# Patient Record
Sex: Female | Born: 1985 | Race: Black or African American | Hispanic: No | Marital: Single | Smoking: Never smoker
Health system: Southern US, Community
[De-identification: ages and names within clinical notes are randomized; demographics above are authoritative.]

---

## 2019-11-19 ENCOUNTER — Emergency Department (HOSPITAL_COMMUNITY)
Admission: EM | Admit: 2019-11-19 | Discharge: 2019-11-19 | Disposition: A | Discharge status: Home / Self Care | Payer: BC Managed Care – PPO | Attending: Emergency Medicine | Admitting: Emergency Medicine

## 2019-11-19 ENCOUNTER — Other Ambulatory Visit: Payer: Self-pay

## 2019-11-19 ENCOUNTER — Encounter (HOSPITAL_COMMUNITY): Payer: Self-pay | Admitting: Emergency Medicine

## 2019-11-19 ENCOUNTER — Emergency Department (HOSPITAL_COMMUNITY): Payer: BC Managed Care – PPO

## 2019-11-19 DIAGNOSIS — R05 Cough: Secondary | ICD-10-CM | POA: Diagnosis present

## 2019-11-19 DIAGNOSIS — R059 Cough, unspecified: Secondary | ICD-10-CM

## 2019-11-19 DIAGNOSIS — U071 COVID-19: Secondary | ICD-10-CM | POA: Diagnosis not present

## 2019-11-19 LAB — SARS CORONAVIRUS 2 BY RT PCR (HOSPITAL ORDER, PERFORMED IN ~~LOC~~ HOSPITAL LAB): SARS Coronavirus 2: POSITIVE — AB

## 2019-11-19 MED ORDER — NAPROXEN 500 MG PO TABS: 500.0000 mg | ORAL_TABLET | Freq: Two times a day (BID) | ORAL | 0 refills | Status: AC | PRN

## 2019-11-19 MED ORDER — AEROCHAMBER PLUS FLO-VU LARGE MISC
1.0000 | Freq: Once | Status: AC
  Administered 2019-11-19: 1

## 2019-11-19 MED ORDER — ALBUTEROL SULFATE HFA 108 (90 BASE) MCG/ACT IN AERS
2.0000 | INHALATION_SPRAY | Freq: Once | RESPIRATORY_TRACT | Status: AC
  Administered 2019-11-19: 2 via RESPIRATORY_TRACT
  Filled 2019-11-19: qty 6.7

## 2019-11-19 MED ORDER — FLUTICASONE PROPIONATE 50 MCG/ACT NA SUSP: 1.0000 | Freq: Every day | NASAL | 0 refills | Status: AC

## 2019-11-19 MED ORDER — BENZONATATE 100 MG PO CAPS: 100.0000 mg | ORAL_CAPSULE | Freq: Three times a day (TID) | ORAL | 0 refills | Status: AC

## 2019-11-19 NOTE — ED Provider Notes (Signed)
[facility] EMERGENCY DEPARTMENT Provider Note   CSN: [id number] Arrival date & time: 11/19/19  0630     History Chief Complaint  Patient presents with  . Cough    Heidi Shepard is a 34 y.o. female without significant past medical hx who presents to the ED with complaints of cough for the past 4-5 days. Patient reports cough is intermittently productive of phlegm sputum, she has associated nasal congestion, sinus pressure, ear pain, sore throat, loss of smell, chills & sweats, and dyspnea with coughing episodes. No alleviating/aggravating factors. Tried OTC sinus medicine without much relief. Was recently in [place] @ large gathering. Has not had COVID 19 vaccinations. No sick contacts w/ similar sxs. Denies fever, chest pain, syncope, leg pain/swelling, hemoptysis, recent surgery/trauma, recent long travel, hormone use, personal hx of cancer, or hx of DVT/PE.    HPI     History reviewed. No pertinent past medical history.  There are no problems to display for this patient.   History reviewed. No pertinent surgical history.   OB History   No obstetric history on file.     History reviewed. No pertinent family history.  Social History   Tobacco Use  . Smoking status: Never Smoker  . Smokeless tobacco: Never Used  Substance Use Topics  . Alcohol use: Yes  . Drug use: Never    Home Medications Prior to Admission medications   Not on File    Allergies    Patient has no allergy information on record.  Review of Systems   Review of Systems  Constitutional: Positive for chills. Negative for fever.       Positive for loss of smell.   HENT: Positive for congestion, ear pain, sinus pressure and sore throat.   Respiratory: Positive for cough and shortness of breath.   Cardiovascular: Negative for chest pain and leg swelling.  Gastrointestinal: Negative for abdominal pain and vomiting.  Neurological: Negative for syncope.   Physical Exam Updated  Vital Signs BP 112/69 (BP Location: Left Arm)   Pulse (!) 105   Temp 98.5 F (36.9 C) (Oral)   Resp 20   SpO2 98%   Physical Exam Vitals and nursing note reviewed.  Constitutional:      General: She is not in acute distress.    Appearance: She is well-developed.  HENT:     Head: Normocephalic and atraumatic.     Right Ear: Ear canal normal. Tympanic membrane is not perforated, erythematous, retracted or bulging.     Left Ear: Ear canal normal. Tympanic membrane is not perforated, erythematous, retracted or bulging.     Ears:     Comments: No mastoid erythema/swelling/tenderness.     Nose: Congestion present.     Right Sinus: No maxillary sinus tenderness or frontal sinus tenderness.     Left Sinus: No maxillary sinus tenderness or frontal sinus tenderness.     Mouth/Throat:     Pharynx: Uvula midline. No oropharyngeal exudate or posterior oropharyngeal erythema.     Comments: Posterior oropharynx is symmetric appearing. Patient tolerating own secretions without difficulty. No trismus. No drooling. No hot potato voice. No swelling beneath the tongue, submandibular compartment is soft.  Eyes:     General:        Right eye: No discharge.        Left eye: No discharge.     Conjunctiva/sclera: Conjunctivae normal.     Pupils: Pupils are equal, round, and reactive to light.  Cardiovascular:  Rate and Rhythm: Normal rate and regular rhythm.     Heart sounds: No murmur heard.   Pulmonary:     Effort: Pulmonary effort is normal. No respiratory distress.     Breath sounds: Normal breath sounds. No wheezing, rhonchi or rales.  Abdominal:     General: There is no distension.     Palpations: Abdomen is soft.     Tenderness: There is no abdominal tenderness.  Musculoskeletal:     Cervical back: Normal range of motion and neck supple. No edema or rigidity.  Lymphadenopathy:     Cervical: No cervical adenopathy.  Skin:    General: Skin is warm and dry.     Findings: No rash.    Neurological:     Mental Status: She is alert.  Psychiatric:        Behavior: Behavior normal.     ED Results / Procedures / Treatments   Labs (all labs ordered are listed, but only abnormal results are displayed) Labs Reviewed  SARS CORONAVIRUS 2 BY RT PCR (HOSPITAL ORDER, PERFORMED IN [facility] LAB)    EKG None  Radiology DG Chest Port 1 View  Result Date: 11/19/2019 CLINICAL DATA:  Cough EXAM: PORTABLE CHEST 1 VIEW COMPARISON:  None. FINDINGS: The heart size and mediastinal contours are within normal limits. Both lungs are clear. The visualized skeletal structures are unremarkable. IMPRESSION: No acute abnormality of the lungs in AP portable projection. Electronically Signed   By: Lauralyn Primes M.D.   On: 11/19/2019 08:30    Procedures Procedures (including critical care time)  Medications Ordered in ED Medications  albuterol (VENTOLIN HFA) 108 (90 Base) MCG/ACT inhaler 2 puff (has no administration in time range)  AeroChamber Plus Flo-Vu Large MISC 1 each (has no administration in time range)    ED Course  I have reviewed the triage vital signs and the nursing notes.  Pertinent labs & imaging results that were available during my care of the patient were reviewed by me and considered in my medical decision making (see chart for details).    Riot Barrick was evaluated in Emergency Department on 11/19/2019 for the symptoms described in the history of present illness. He/she was evaluated in the context of the global COVID-19 pandemic, which necessitated consideration that the patient might be at risk for infection with the SARS-CoV-2 virus that causes COVID-19. Institutional protocols and algorithms that pertain to the evaluation of patients at risk for COVID-19 are in a state of rapid change based on information released by regulatory bodies including the CDC and federal and state organizations. These policies and algorithms were followed during the patient's care in  the ED.  MDM Rules/Calculators/A&P                          Patient presents to the emergency department with upper and lower respiratory symptoms over the past 4 to 5 days.  She is nontoxic, resting comfortably, vitals within normal limits on my assessment, initial mild tachycardia resolved.  Afebrile, symptoms less than 7 days, no sinus tenderness, low suspicion for acute bacterial sinusitis.  No signs of AOM, AOE, or mastoiditis.  Centor score 0 doubt strep, no signs of RPA/PTA.  No meningismus.  Lungs clear to auscultation bilaterally, no wheezing, no focal adventitious sounds, chest x-ray without infiltrate to suggest community-acquired pneumonia- CXR ordered by me, personally reviewed & interpreted & agree with radiologist read..  No signs of fluid overload.  Patient  is low risk Wells, doubt pulmonary embolism.  Suspect viral versus allergic, COVID-19 test is pending, she ambulated in the emergency department and maintain SPO2 greater than 95% on room air without signs of respiratory distress.  She appears appropriate for discharge home, will treat supportively, discussed need for isolation. I discussed results, treatment plan, need for follow-up, and return precautions with the patient. Provided opportunity for questions, patient confirmed understanding and is in agreement with plan.   Final Clinical Impression(s) / ED Diagnoses Final diagnoses:  Cough    Rx / DC Orders ED Discharge Orders         Ordered    naproxen (NAPROSYN) 500 MG tablet  2 times daily PRN     Discontinue  Reprint     11/19/19 0913    benzonatate (TESSALON) 100 MG capsule  Every 8 hours     Discontinue  Reprint     11/19/19 0913    fluticasone (FLONASE) 50 MCG/ACT nasal spray  Daily     Discontinue  Reprint     11/19/19 0913           Syrena Burges, Pleas Koch, PA-C 11/19/19 6269    Geoffery Lyons, MD 11/19/19 1246

## 2019-11-19 NOTE — ED Notes (Signed)
AVS verbalized with pt who verbalized understanding. Pt ambulatory out of dept/

## 2019-11-19 NOTE — Discharge Instructions (Addendum)
You are seen in the emergency department today for a cough and sinus pressure.  Your chest x-ray was normal.  We suspect your symptoms are related to a virus, less likely allergies.  We are sending you home with Tessalon to take every 8 hours as needed for coughing, Flonase to take daily 1 spray per nostril as needed for congestion, and naproxen to take every 12 hours as needed for pain.  Please do not take naproxen with food as it can cause stomach upset and it or stomach bleeding.  Do not take other NSAIDs such as Motrin, Advil, Aleve, Mobic, meloxicam, ibuprofen, Goody powder, etc. with naproxen as it is similar.  We have prescribed you new medication(s) today. Discuss the medications prescribed today with your pharmacist as they can have adverse effects and interactions with your other medicines including over the counter and prescribed medications. Seek medical evaluation if you start to experience new or abnormal symptoms after taking one of these medicines, seek care immediately if you start to experience difficulty breathing, feeling of your throat closing, facial swelling, or rash as these could be indications of a more serious allergic reaction  You may also use the inhaler provided 1 to 2 puffs every 4-6 hours as needed for shortness of breath or wheezing.  We have tested you for COVID 19, we will call you within the next 72 hours if results are positive, you may also view these results on MyChart.   We are instructing patient's with COVID 19 or symptoms of COVID 19 to quarantine themselves for 14 days. You may be able to discontinue self quarantine if the following conditions are met:   Persons with COVID-19 who have symptoms and were directed to care for themselves at home may discontinue home isolation under the  following conditions: - It has been at least 7 days have passed since symptoms first appeared. - AND at least 3 days (72 hours) have passed since recovery defined as resolution of  fever without the use of fever-reducing medications and improvement in respiratory symptoms (e.g., cough, shortness of breath)  Please follow the below quarantine instructions.   Please follow up with primary care within 3-5 days for re-evaluation- call prior to going to the office to make them aware of your symptoms as some offices are altering their method of seeing patients with COVID 19 symptoms. Return to the ER for new or worsening symptoms including but not limited to increased work of breathing, chest pain, passing out, or any other concerns.       Person Under Monitoring Name: Heidi Shepard  Location: [place] [address]   Infection Prevention Recommendations for Individuals Confirmed to have, or Being Evaluated for, 2019 Novel Coronavirus (COVID-19) Infection Who Receive Care at Home  Individuals who are confirmed to have, or are being evaluated for, COVID-19 should follow the prevention steps below until a healthcare provider or local or state health department says they can return to normal activities.  Stay home except to get medical care You should restrict activities outside your home, except for getting medical care. Do not go to work, school, or public areas, and do not use public transportation or taxis.  Call ahead before visiting your doctor Before your medical appointment, call the healthcare provider and tell them that you have, or are being evaluated for, COVID-19 infection. This will help the healthcare provider's office take steps to keep other people from getting infected. Ask your healthcare provider  to call the local or state health department.  Monitor your symptoms Seek prompt medical attention if your illness is worsening (e.g., difficulty breathing). Before going to your medical appointment, call the healthcare provider and tell them that you have, or are being evaluated for, COVID-19 infection. Ask your healthcare provider  to call the local or state health department.  Wear a facemask You should wear a facemask that covers your nose and mouth when you are in the same room with other people and when you visit a healthcare provider. People who live with or visit you should also wear a facemask while they are in the same room with you.  Separate yourself from other people in your home As much as possible, you should stay in a different room from other people in your home. Also, you should use a separate bathroom, if available.  Avoid sharing household items You should not share dishes, drinking glasses, cups, eating utensils, towels, bedding, or other items with other people in your home. After using these items, you should wash them thoroughly with soap and water.  Cover your coughs and sneezes Cover your mouth and nose with a tissue when you cough or sneeze, or you can cough or sneeze into your sleeve. Throw used tissues in a lined trash can, and immediately wash your hands with soap and water for at least 20 seconds or use an alcohol-based hand rub.  Wash your [facility] your hands often and thoroughly with soap and water for at least 20 seconds. You can use an alcohol-based hand sanitizer if soap and water are not available and if your hands are not visibly dirty. Avoid touching your eyes, nose, and mouth with unwashed hands.   Prevention Steps for Caregivers and Household Members of Individuals Confirmed to have, or Being Evaluated for, COVID-19 Infection Being Cared for in the Home  If you live with, or provide care at home for, a person confirmed to have, or being evaluated for, COVID-19 infection please follow these guidelines to prevent infection:  Follow healthcare provider's instructions Make sure that you understand and can help the patient follow any healthcare provider instructions for all care.  Provide for the patient's basic needs You should help the patient with basic needs in the  home and provide support for getting groceries, prescriptions, and other personal needs.  Monitor the patient's symptoms If they are getting sicker, call his or her medical provider and tell them that the patient has, or is being evaluated for, COVID-19 infection. This will help the healthcare provider's office take steps to keep other people from getting infected. Ask the healthcare provider to call the local or state health department.  Limit the number of people who have contact with the patient If possible, have only one caregiver for the patient. Other household members should stay in another home or place of residence. If this is not possible, they should stay in another room, or be separated from the patient as much as possible. Use a separate bathroom, if available. Restrict visitors who do not have an essential need to be in the home.  Keep older adults, very young children, and other sick people away from the patient Keep older adults, very young children, and those who have compromised immune systems or chronic health conditions away from the patient. This includes people with chronic heart, lung, or kidney conditions, diabetes, and cancer.  Ensure good ventilation Make sure that shared spaces in the home have good air flow, such  as from an air conditioner or an opened window, weather permitting.  Wash your hands often Wash your hands often and thoroughly with soap and water for at least 20 seconds. You can use an alcohol based hand sanitizer if soap and water are not available and if your hands are not visibly dirty. Avoid touching your eyes, nose, and mouth with unwashed hands. Use disposable paper towels to dry your hands. If not available, use dedicated cloth towels and replace them when they become wet.  Wear a facemask and gloves Wear a disposable facemask at all times in the room and gloves when you touch or have contact with the patient's blood, body fluids, and/or  secretions or excretions, such as sweat, saliva, sputum, nasal mucus, vomit, urine, or feces.  Ensure the mask fits over your nose and mouth tightly, and do not touch it during use. Throw out disposable facemasks and gloves after using them. Do not reuse. Wash your hands immediately after removing your facemask and gloves. If your personal clothing becomes contaminated, carefully remove clothing and launder. Wash your hands after handling contaminated clothing. Place all used disposable facemasks, gloves, and other waste in a lined container before disposing them with other household waste. Remove gloves and wash your hands immediately after handling these items.  Do not share dishes, glasses, or other household items with the patient Avoid sharing household items. You should not share dishes, drinking glasses, cups, eating utensils, towels, bedding, or other items with a patient who is confirmed to have, or being evaluated for, COVID-19 infection. After the person uses these items, you should wash them thoroughly with soap and water.  Wash laundry thoroughly Immediately remove and wash clothes or bedding that have blood, body fluids, and/or secretions or excretions, such as sweat, saliva, sputum, nasal mucus, vomit, urine, or feces, on them. Wear gloves when handling laundry from the patient. Read and follow directions on labels of laundry or clothing items and detergent. In general, wash and dry with the warmest temperatures recommended on the label.  Clean all areas the individual has used often Clean all touchable surfaces, such as counters, tabletops, doorknobs, bathroom fixtures, toilets, phones, keyboards, tablets, and bedside tables, every day. Also, clean any surfaces that may have blood, body fluids, and/or secretions or excretions on them. Wear gloves when cleaning surfaces the patient has come in contact with. Use a diluted bleach solution (e.g., dilute bleach with 1 part bleach and 10  parts water) or a household disinfectant with a label that says EPA-registered for coronaviruses. To make a bleach solution at home, add 1 tablespoon of bleach to 1 quart (4 cups) of water. For a larger supply, add  cup of bleach to 1 gallon (16 cups) of water. Read labels of cleaning products and follow recommendations provided on product labels. Labels contain instructions for safe and effective use of the cleaning product including precautions you should take when applying the product, such as wearing gloves or eye protection and making sure you have good ventilation during use of the product. Remove gloves and wash hands immediately after cleaning.  Monitor yourself for signs and symptoms of illness Caregivers and household members are considered close contacts, should monitor their health, and will be asked to limit movement outside of the home to the extent possible. Follow the monitoring steps for close contacts listed on the symptom monitoring form.   ? If you have additional questions, contact your local health department or call the epidemiologist on call at [phone] (  available 24/7). ? This guidance is subject to change. For the most up-to-date guidance from [facility], please refer to their website: [email or web address]

## 2019-11-19 NOTE — ED Triage Notes (Signed)
Pt in w/Covid symptoms x 1 wk. States she went to [place] the weekend prior, and attended a large party. Current symptoms cough, sob and loss of smell. O2 sats 98% RA

## 2019-12-13 ENCOUNTER — Telehealth (HOSPITAL_COMMUNITY): Payer: Self-pay

## 2020-06-05 ENCOUNTER — Other Ambulatory Visit: Payer: Self-pay | Admitting: Physician Assistant

## 2020-06-05 DIAGNOSIS — Z1231 Encounter for screening mammogram for malignant neoplasm of breast: Secondary | ICD-10-CM

## 2020-06-11 ENCOUNTER — Ambulatory Visit
Admission: RE | Admit: 2020-06-11 | Discharge: 2020-06-11 | Disposition: A | Discharge status: Home / Self Care | Payer: BC Managed Care – PPO | Source: Ambulatory Visit | Attending: Physician Assistant | Admitting: Physician Assistant

## 2020-06-11 ENCOUNTER — Other Ambulatory Visit: Payer: Self-pay

## 2020-06-11 ENCOUNTER — Other Ambulatory Visit: Payer: Self-pay | Admitting: Physician Assistant

## 2020-06-11 DIAGNOSIS — N63 Unspecified lump in unspecified breast: Secondary | ICD-10-CM

## 2020-06-11 DIAGNOSIS — Z1231 Encounter for screening mammogram for malignant neoplasm of breast: Secondary | ICD-10-CM

## 2020-06-30 ENCOUNTER — Ambulatory Visit
Admission: RE | Admit: 2020-06-30 | Discharge: 2020-06-30 | Disposition: A | Discharge status: Home / Self Care | Payer: BC Managed Care – PPO | Source: Ambulatory Visit | Attending: Family Medicine | Admitting: Family Medicine

## 2020-06-30 ENCOUNTER — Other Ambulatory Visit: Payer: Self-pay | Admitting: Family Medicine

## 2020-06-30 ENCOUNTER — Other Ambulatory Visit: Payer: Self-pay

## 2020-06-30 DIAGNOSIS — M25561 Pain in right knee: Secondary | ICD-10-CM

## 2020-07-28 ENCOUNTER — Other Ambulatory Visit: Payer: Self-pay

## 2020-07-28 ENCOUNTER — Ambulatory Visit
Admission: RE | Admit: 2020-07-28 | Discharge: 2020-07-28 | Disposition: A | Discharge status: Home / Self Care | Payer: BC Managed Care – PPO | Source: Ambulatory Visit | Attending: Physician Assistant | Admitting: Physician Assistant

## 2020-07-28 DIAGNOSIS — N63 Unspecified lump in unspecified breast: Secondary | ICD-10-CM

## 2021-10-29 IMAGING — MG DIGITAL DIAGNOSTIC BILAT W/ TOMO W/ CAD
8 series · 8 of 24 positions shown · non-contrast
Comparison: None.

CLINICAL DATA: 34-year-old female presenting for evaluation of a
possible lump felt by the patient's physician in the superior right
breast. The patient is unable to feel the lump.

EXAM:
DIGITAL DIAGNOSTIC BILATERAL MAMMOGRAM WITH TOMOSYNTHESIS AND CAD;
ULTRASOUND RIGHT BREAST LIMITED
TECHNIQUE: Bilateral digital diagnostic mammography and breast tomosynthesis
was performed. The images were evaluated with computer-aided
detection.; Targeted ultrasound examination of the right breast was
performed

[L MLO synth-2D]
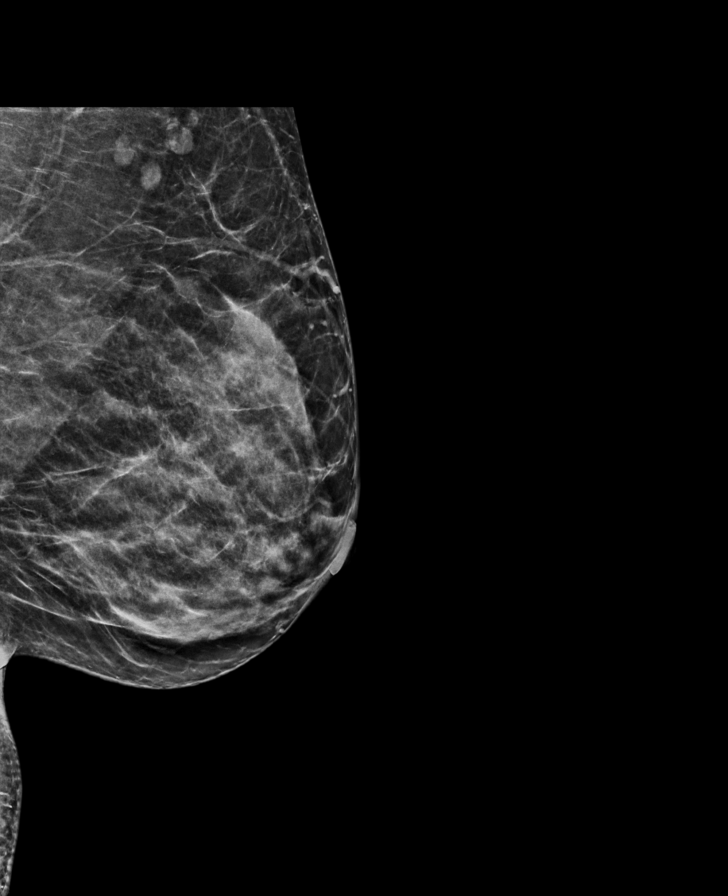

[L CC synth-2D]
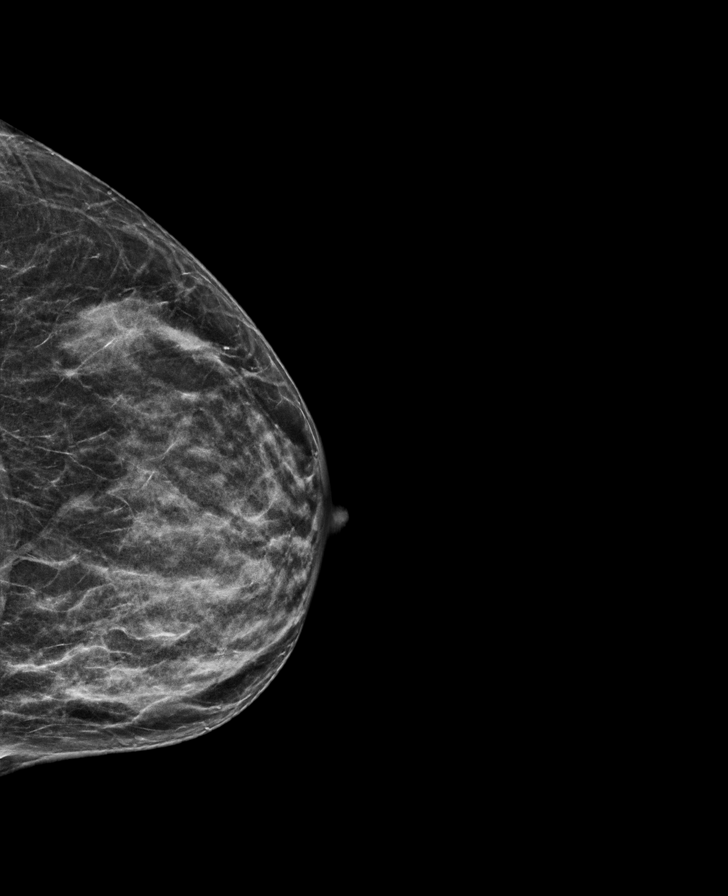

[R CC synth-2D]
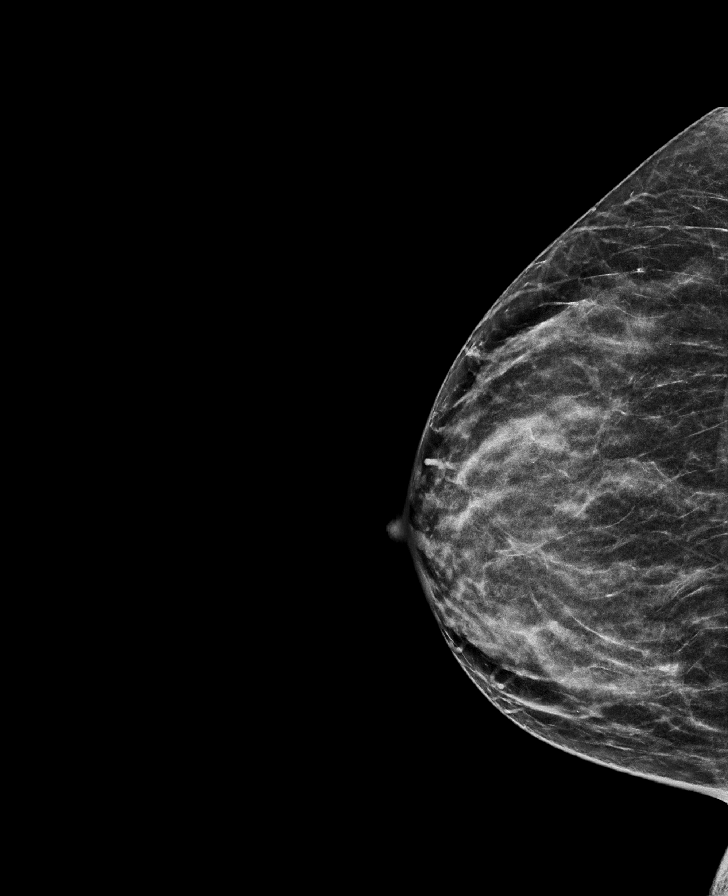

[R MLO synth-2D]
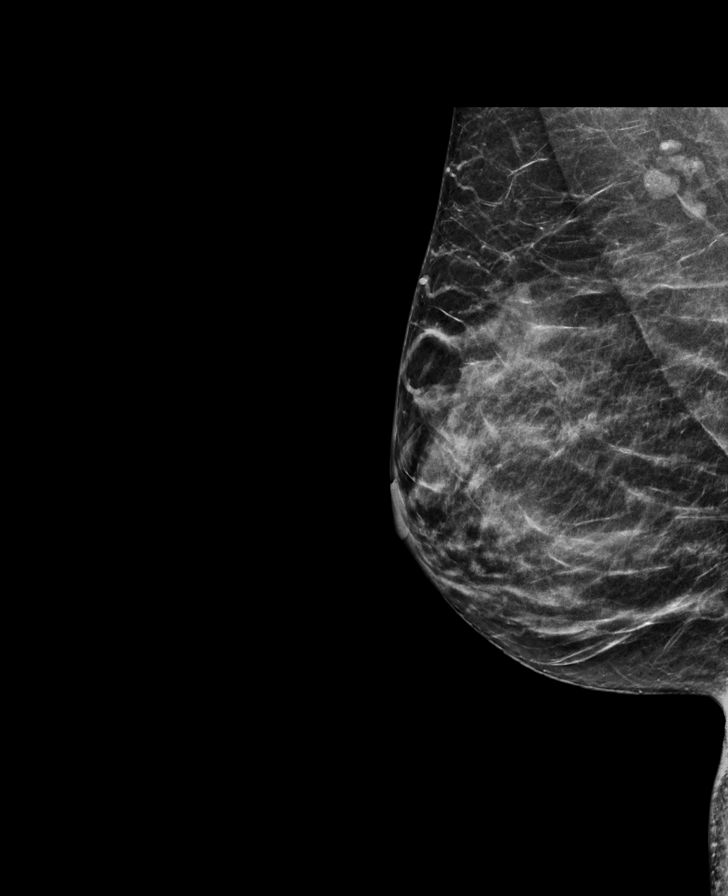

[L MLO tomo · tomo slice 28/55.0]
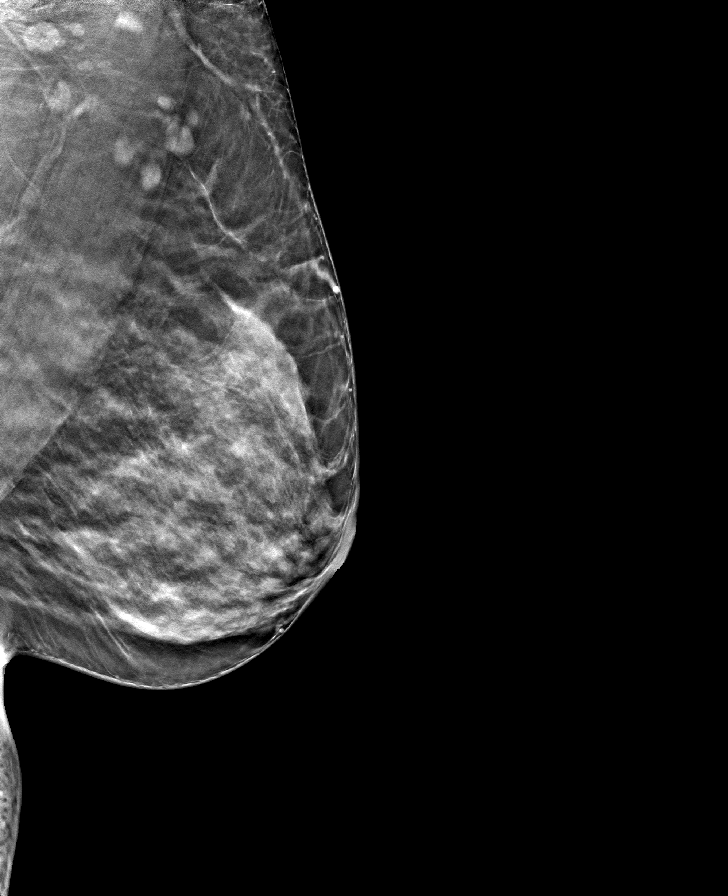

[R MLO tomo · tomo slice 29/57.0]
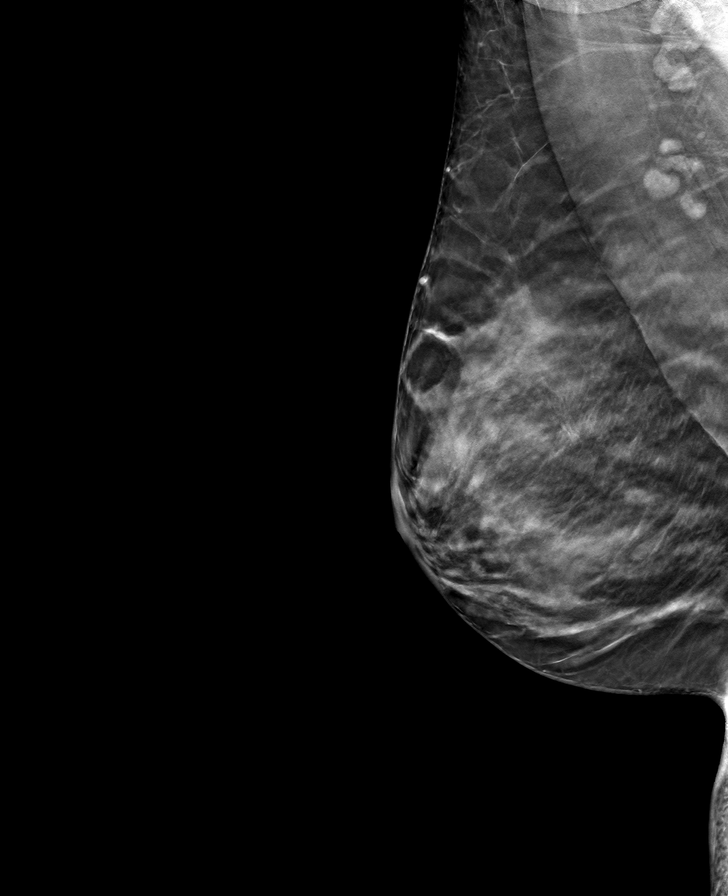

[L CC tomo · tomo slice 27/53.0]
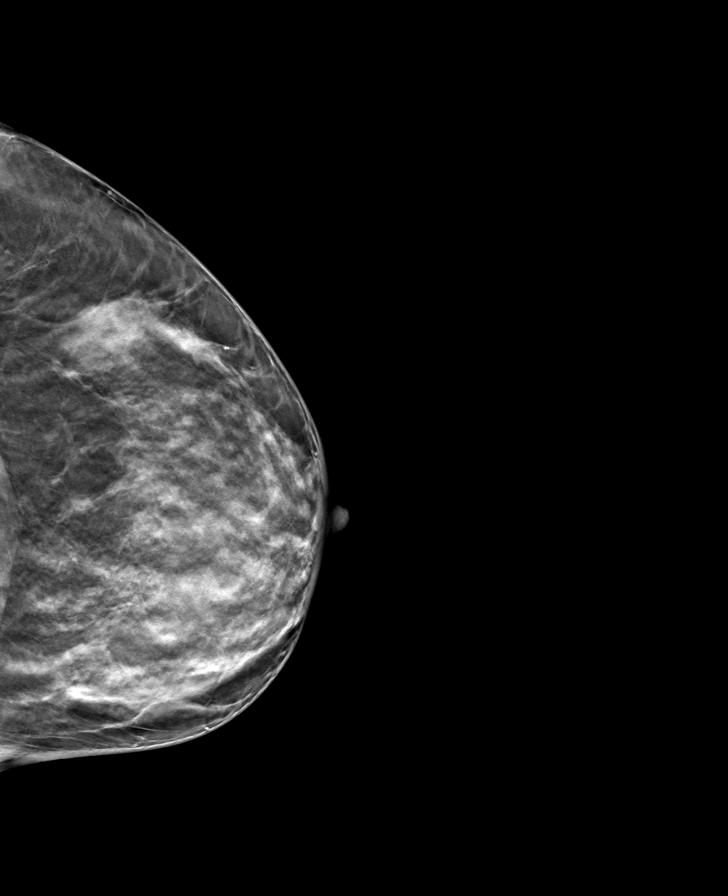

[R CC tomo · tomo slice 27/53.0]
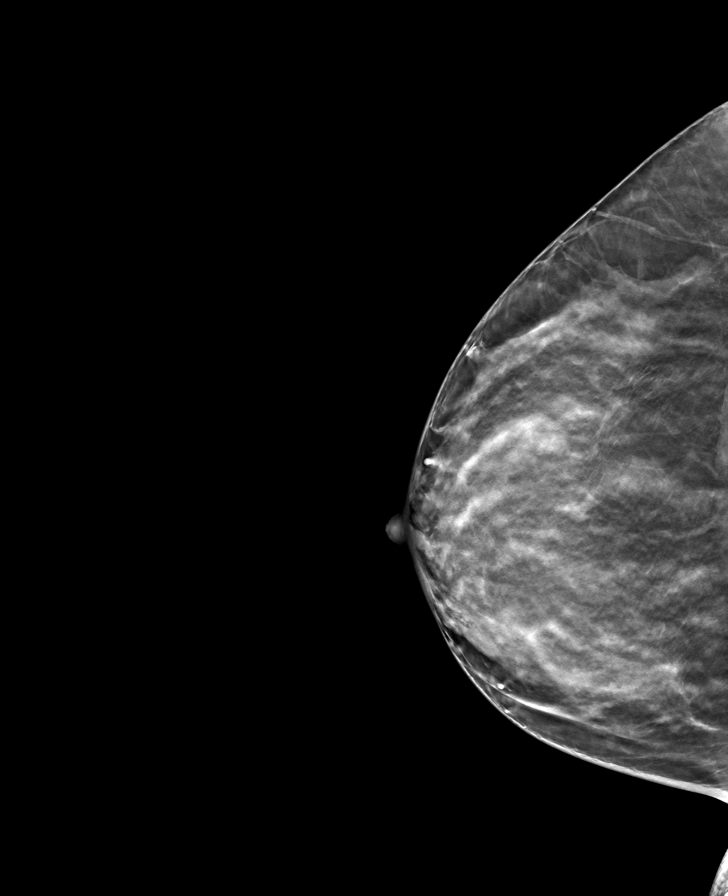

[8 of 24 positions shown; findings below may reference images not displayed]

ACR Breast Density Category c: The breast tissue is heterogeneously
dense, which may obscure small masses.
FINDINGS: Mammogram:

Right breast: No suspicious mass, distortion, or microcalcifications
are identified to suggest presence of malignancy. Specifically there
is no abnormality in the superior central right breast.

Left breast: No suspicious mass, distortion, or microcalcifications
are identified to suggest presence of malignancy.

On physical exam in the superior right breast I do not feel a fixed
discrete mass.

Ultrasound:

Targeted ultrasound performed in the 12 o'clock area of the right
breast demonstrating a likely incidental cluster of cysts at 12
o'clock 2 cm from nipple measuring 0.8 x 0.5 x 0.7 cm. No internal
vascularity. No suspicious solid mass.
IMPRESSION: 1. In the superior central right breast there is a likely incidental
benign cluster of cysts. No mammographic or sonographic evidence of
malignancy.

2.  No mammographic evidence of malignancy in the left breast.

RECOMMENDATION:
1.  Continued clinical surveillance of the bilateral breasts.

2.  Return for routine annual screening mammography at age 40.

I have discussed the findings and recommendations with the patient.
If applicable, a reminder letter will be sent to the patient
regarding the next appointment.

BI-RADS CATEGORY  2: Benign.
